# Patient Record
Sex: Male | Born: 1965 | Race: White | Hispanic: No | Marital: Married | Smoking: Never smoker
Health system: Southern US, Community
[De-identification: ages and names within clinical notes are randomized; demographics above are authoritative.]

## PROBLEM LIST (undated history)

## (undated) DIAGNOSIS — F329 Major depressive disorder, single episode, unspecified: Secondary | ICD-10-CM

## (undated) DIAGNOSIS — E785 Hyperlipidemia, unspecified: Secondary | ICD-10-CM

## (undated) DIAGNOSIS — F32A Depression, unspecified: Secondary | ICD-10-CM

## (undated) HISTORY — PX: TONSILLECTOMY: SUR1361

---

## 2016-09-05 DIAGNOSIS — E782 Mixed hyperlipidemia: Secondary | ICD-10-CM | POA: Insufficient documentation

## 2016-09-05 DIAGNOSIS — M7702 Medial epicondylitis, left elbow: Secondary | ICD-10-CM | POA: Insufficient documentation

## 2016-12-24 ENCOUNTER — Emergency Department (HOSPITAL_BASED_OUTPATIENT_CLINIC_OR_DEPARTMENT_OTHER)
Admission: EM | Admit: 2016-12-24 | Discharge: 2016-12-24 | Disposition: A | Discharge status: Home / Self Care | Payer: Worker's Compensation | Attending: Emergency Medicine | Admitting: Emergency Medicine

## 2016-12-24 ENCOUNTER — Emergency Department (HOSPITAL_BASED_OUTPATIENT_CLINIC_OR_DEPARTMENT_OTHER): Payer: Worker's Compensation

## 2016-12-24 DIAGNOSIS — Y990 Civilian activity done for income or pay: Secondary | ICD-10-CM | POA: Insufficient documentation

## 2016-12-24 DIAGNOSIS — Y939 Activity, unspecified: Secondary | ICD-10-CM | POA: Insufficient documentation

## 2016-12-24 DIAGNOSIS — Y929 Unspecified place or not applicable: Secondary | ICD-10-CM | POA: Insufficient documentation

## 2016-12-24 DIAGNOSIS — S93602A Unspecified sprain of left foot, initial encounter: Secondary | ICD-10-CM

## 2016-12-24 DIAGNOSIS — Z79899 Other long term (current) drug therapy: Secondary | ICD-10-CM | POA: Diagnosis not present

## 2016-12-24 DIAGNOSIS — S93401A Sprain of unspecified ligament of right ankle, initial encounter: Secondary | ICD-10-CM | POA: Insufficient documentation

## 2016-12-24 DIAGNOSIS — S99911A Unspecified injury of right ankle, initial encounter: Secondary | ICD-10-CM | POA: Diagnosis present

## 2016-12-24 DIAGNOSIS — W1839XA Other fall on same level, initial encounter: Secondary | ICD-10-CM | POA: Insufficient documentation

## 2016-12-24 NOTE — Discharge Instructions (Signed)
Wear cam walker as applied.  Ice for 20 minutes every 2 hours while awake for the next 2 days.  Rest.  Follow-up with Dr. Magnus IvanBlackman if your symptoms are not improving in the next 3-4 days. His contact information has been provided in this discharge summary for you to call and make these arrangements.

## 2016-12-24 NOTE — ED Provider Notes (Signed)
[facility] Provider Note   CSN: [id number] Arrival date & time: 12/24/16  1610  By signing my name below, I, Linna Darner, attest that this documentation has been prepared under the direction and in the presence of physician practitioner, Geoffery Lyons, MD. Electronically Signed: Linna Darner, Scribe. 12/24/2016. 4:24 PM.  History   Chief Complaint Chief Complaint  Patient presents with  . Ankle Pain    The history is provided by the patient. No language interpreter was used.     HPI Comments: Patrick Lindsey is a 51 y.o. male who presents to the Emergency Department complaining of sudden onset, constant, severe, lateral right ankle pain beginning earlier today. 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 He states he cannot bear weight on his right foot or ambulate secondary to his ankle pain. He endorses pain exacerbation with any degree of applied pressure to his lateral right ankle. No medications tried PTA. Pt denies joint swelling, numbness/tingling, or any other associated symptoms.  No past medical history on file.  There are no active problems to display for this patient.   No past surgical history on file.     Home Medications    Prior to Admission medications   Medication Sig Start Date End Date Taking? Authorizing Provider  buPROPion (WELLBUTRIN) 75 MG tablet Take 75 mg by mouth 2 (two) times daily.   Yes Historical Provider, MD  DULoxetine (CYMBALTA) 60 MG capsule Take 60 mg by mouth daily.   Yes Historical Provider, MD  loratadine (CLARITIN) 10 MG tablet Take 10 mg by mouth daily.   Yes Historical Provider, MD  simvastatin (ZOCOR) 40 MG tablet Take 40 mg by mouth daily.   Yes Historical Provider, MD    Family History No family history on file.  Social History Social History  Substance Use Topics    . Smoking status: Not on file  . Smokeless tobacco: Not on file  . Alcohol use Not on file     Allergies   Patient has no allergy information on record.   Review of Systems Review of Systems  Musculoskeletal: Positive for arthralgias and gait problem. Negative for joint swelling.  Neurological: Negative for numbness.  All other systems reviewed and are negative.  Physical Exam Updated Vital Signs BP (!) 158/108 (BP Location: Left Arm)   Pulse 88   Temp 98.4 F (36.9 C) (Oral)   Resp 20   Ht 5\' 11"  (1.803 m)   Wt 234 lb (106.1 kg)   SpO2 100%   BMI 32.64 kg/m   Physical Exam  Constitutional: He is oriented to person, place, and time. He appears well-developed and well-nourished.  HENT:  Head: Normocephalic.  Eyes: EOM are normal.  Neck: Normal range of motion.  Pulmonary/Chest: Effort normal.  Abdominal: He exhibits no distension.  Musculoskeletal: Normal range of motion. He exhibits tenderness.  The right foot and ankle appear grossly normal. There is exquisite tenderness over the posterior calcaneus. There is no tenderness over the achilles. Thompson test is negative.  Neurological: He is alert and oriented to person, place, and time.  Psychiatric: He has a normal mood and affect.  Nursing note and vitals reviewed.  ED Treatments / Results  Labs (all labs ordered are listed, but only abnormal results are displayed) Labs Reviewed - No data to display  EKG  EKG  Interpretation None       Radiology No results found.  Procedures Procedures (including critical care time)  DIAGNOSTIC STUDIES: Oxygen Saturation is 100% on RA, normal by my interpretation.    COORDINATION OF CARE: 4:28 PM Discussed treatment plan with pt at bedside and pt agreed to plan.  Medications Ordered in ED Medications - No data to display   Initial Impression / Assessment and Plan / ED Course  I have reviewed the triage vital signs and the nursing notes.  Pertinent labs &  imaging results that were available during my care of the patient were reviewed by me and considered in my medical decision making (see chart for details).  X-rays are negative for fracture. There is the finding of an os perineum adjacent to the cuboid bone. He is not tender in this area and I am certain does not represent a fracture. His tenderness is over the posterior aspect of the calcaneus near the insertion of the Achilles tendon. There is no evidence for tendon rupture as Thompson's test is negative. It is possible that he may have a partial tear. For this reason he will be placed in a Cam Walker and will follow-up with orthopedics if not improving in the next several days.  Final Clinical Impressions(s) / ED Diagnoses   Final diagnoses:  None    New Prescriptions New Prescriptions   No medications on file   I personally performed the services described in this documentation, which was scribed in my presence. The recorded information has been reviewed and is accurate.       Geoffery Lyonsouglas Barbra Miner, MD 12/24/16 [phone]

## 2016-12-24 NOTE — ED Triage Notes (Signed)
Pt was stepping backwards off of a pallet when pallet slat broke, causing pt to fall backwards and left ankle was trapped between slats and twisted. Pt also reports some left knee pain from fall also.

## 2017-01-04 ENCOUNTER — Other Ambulatory Visit (INDEPENDENT_AMBULATORY_CARE_PROVIDER_SITE_OTHER): Payer: Self-pay

## 2017-01-04 ENCOUNTER — Ambulatory Visit (INDEPENDENT_AMBULATORY_CARE_PROVIDER_SITE_OTHER): Payer: Worker's Compensation | Admitting: Physician Assistant

## 2017-01-04 DIAGNOSIS — F419 Anxiety disorder, unspecified: Secondary | ICD-10-CM | POA: Insufficient documentation

## 2017-01-04 DIAGNOSIS — F329 Major depressive disorder, single episode, unspecified: Secondary | ICD-10-CM | POA: Insufficient documentation

## 2017-01-04 DIAGNOSIS — S99911D Unspecified injury of right ankle, subsequent encounter: Secondary | ICD-10-CM

## 2017-01-04 DIAGNOSIS — M7661 Achilles tendinitis, right leg: Secondary | ICD-10-CM

## 2017-01-04 DIAGNOSIS — F32A Depression, unspecified: Secondary | ICD-10-CM | POA: Insufficient documentation

## 2017-01-04 MED ORDER — DICLOFENAC SODIUM 1 % TD GEL: 4.0000 g | Freq: Four times a day (QID) | TRANSDERMAL | Status: AC

## 2017-01-04 NOTE — Progress Notes (Signed)
Office Visit Note   Patient: Patrick Lindsey           Date of Birth: 1966/02/19           MRN: [id number] Visit Date: 01/04/2017              Requested by: Dan Maker, MD [address] [place], [place] [address] PCP: Dan Maker, MD   Assessment & Plan: Visit Diagnoses:  1. Achilles tendinitis, right leg     Plan: Keep him out of work for the next 2 weeks to reevaluate in 2 weeks. He is to stay in the Cam Walker boot when up ambulating place and 9/16 inch heel lift in his Cam Conservation officer, nature. We'll send a physical therapy for modalities range of motion of the ankle particularly to the insertion of the Achilles and gastroc soleus stretching. Converged ice and 1520 minutes 2-3 times daily to the Achilles insertion. Also have him apply Voltaren gel 4 g 4 times daily to the right heel. Discussed shoe wear with the patient.  Follow-Up Instructions: Return in about 2 weeks (around 01/18/2017).   Orders:  No orders of the defined types were placed in this encounter.  Meds ordered this encounter  Medications  . diclofenac sodium (VOLTAREN) 1 % transdermal gel 4 g      Procedures: No procedures performed   Clinical Data: No additional findings.   Subjective: Right ankle pain  HPI Patrick Lindsey is a 51 year old male who were seen for the first time for an injury which occurred at work on 12/22/2016. He reports that he was standing on a pallet is some materials off of the patella with SLAP broke he fell through the width hyper flexing his ankle. He went to the ER where radiographs were obtained of his right foot and ankle. These were negative for acute fracture or bony abnormalities. I personally reviewed these. He was placed in the Cam Walker boot due to the tenderness at the Achilles insertion. Review of Systems No chest pain shortness breath fevers chills nausea or vomiting  Objective: Vital Signs: There were no vitals taken for this visit.  Physical  Exam  Constitutional: He is oriented to person, place, and time. He appears well-developed and well-nourished. No distress.  Cardiovascular: Intact distal pulses.   Pulmonary/Chest: Effort normal.  Neurological: He is alert and oriented to person, place, and time.  Psychiatric: He has a normal mood and affect.    Ortho Exam Right ankle he has good dorsiflexion plantar flexion without pain forced dorsiflexion of the ankle causes pain knee at the Achilles insertion. Negative Thompson's test on the right. Calves are supple nontender bilaterally. 5 out of 5 strengths inversion eversion against resistance bilateral feet. No tenderness over the posterior tibial tendons bilaterally. He has minimal tenderness over the right  peroneal tendons. No ecchymosis edema appreciated either foot. No tenderness over the anterior aspect of both ankles or the sinus tarsi bilaterally He has evidence pes tinea of bilateral feet. Specialty Comments:  No specialty comments available.  Imaging: No results found.   PMFS History: Patient Active Problem List   Diagnosis Date Noted  . Anxiety 01/04/2017  . Depression 01/04/2017  . 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 Mixed hyperlipidemia 09/05/2016   No past medical history on file.  No family history on file.  No past surgical history on file. Social History   Occupational History  . Not on file.   Social History Main Topics  .  Smoking status: Not on file  . Smokeless tobacco: Not on file  . Alcohol use Not on file  . Drug use: Unknown  . Sexual activity: Not on file

## 2017-01-04 NOTE — Progress Notes (Signed)
Is this WC? He was checked in under [facility]

## 2017-01-10 ENCOUNTER — Telehealth (INDEPENDENT_AMBULATORY_CARE_PROVIDER_SITE_OTHER): Payer: Self-pay

## 2017-01-10 NOTE — Telephone Encounter (Signed)
Received vm requesting the last office and work note and PT order for this work comp patient. Faxed to [phone].

## 2017-01-12 ENCOUNTER — Telehealth (INDEPENDENT_AMBULATORY_CARE_PROVIDER_SITE_OTHER): Payer: Self-pay

## 2017-01-12 NOTE — Telephone Encounter (Signed)
Received vm from case mgr just wanting to let us know she was now on the file and PT has been approved and sent for scheduling.

## 2017-01-13 ENCOUNTER — Encounter: Payer: Self-pay | Admitting: Rehabilitation

## 2017-01-13 ENCOUNTER — Ambulatory Visit: Payer: BLUE CROSS/BLUE SHIELD | Attending: Physician Assistant | Admitting: Rehabilitation

## 2017-01-13 DIAGNOSIS — M25571 Pain in right ankle and joints of right foot: Secondary | ICD-10-CM

## 2017-01-13 DIAGNOSIS — M25671 Stiffness of right ankle, not elsewhere classified: Secondary | ICD-10-CM

## 2017-01-13 NOTE — Therapy (Signed)
[facility] [address] [place], [place], [address] Phone: [phone]   Fax:  [phone]  Physical Therapy Evaluation  Patient Details  Name: Patrick Lindsey MRN: [id number] Date of Birth: 03-27-1966 Referring Provider: Chestine Spore  Encounter Date: 01/13/2017      PT End of Session - 01/13/17 1158    Visit Number 1   Date for PT Re-Evaluation 02/24/17   PT Start Time 0930   PT Stop Time 1015   PT Time Calculation (min) 45 min   Activity Tolerance Patient tolerated treatment well      History reviewed. No pertinent past medical history.  History reviewed. No pertinent surgical history.  There were no vitals filed for this visit.       Subjective Assessment - 01/13/17 0930    Subjective Pt presents with R ankle pain after standing on a wooden pallet that broke at work on 12/22/16.  Went to ER 3/24 xrays normal given a diagnosis of a sprain and given a walking boot.  Went to [employer] and given a new diagnosis of achilles tear.  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 Also has a lift in the boot.      Limitations Standing;Walking   Diagnostic tests xrays normal   Patient Stated Goals back to work, back to normal   Currently in Pain? Yes   Pain Score 4    Pain Location Ankle   Pain Orientation Right   Pain Descriptors / Indicators Aching   Pain Type Acute pain   Pain Onset 1 to 4 weeks ago   Pain Frequency Constant   Aggravating Factors  walking and standing up to 7/10   Pain Relieving Factors rest, ice            [facility] PT Assessment - 01/13/17 0001      Assessment   Medical Diagnosis R achilles insertion tear/ankle sprain   Referring Provider Clark   Onset Date/Surgical Date 12/22/16   Next MD Visit 01/18/17   Prior Therapy no     Precautions   Precaution Comments to be in CAM boot with lift when not at home     Restrictions   Weight Bearing Restrictions No     Balance Screen   Has the patient fallen in the past 6 months No     Prior Function   Vocation Full time employment   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      ROM / Strength   AROM / PROM / Strength AROM;PROM;Strength     AROM   Overall AROM Comments in seated   AROM Assessment Site Ankle   Right/Left Ankle Right   Right Ankle Dorsiflexion 15   Right Ankle Plantar Flexion 40   Right Ankle Inversion 35   Right Ankle Eversion 10     PROM   PROM Assessment Site Ankle   Right/Left Ankle Right   Right Ankle Dorsiflexion 14   Right Ankle Plantar Flexion 40   Right Ankle Inversion 35   Right Ankle Eversion 10     Strength   Strength Assessment Site Ankle   Right/Left Ankle Right   Right Ankle Dorsiflexion 4+/5   Right Ankle Plantar Flexion 4+/5   Right Ankle Inversion 4/5   Right Ankle Eversion 4/5     Palpation   Palpation comment 3  ttp R bony abnormality at achilles insertion     Ambulation/Gait   Gait Comments ambulating toe out, decreased DF and knee flexion                   [facility] Adult PT Treatment/Exercise - 01/13/17 0001      Modalities   Modalities Ultrasound     Ultrasound   Ultrasound Location R achilles   Ultrasound Parameters 50%, 1 MHz, 1.3wcm2   Ultrasound Goals Pain                PT Education - 01/13/17 1157    Education provided Yes   Person(s) Educated Patient   Methods Explanation;Demonstration;Handout;Tactile cues;Verbal cues   Comprehension Verbalized understanding;Returned demonstration             PT Long Term Goals - 01/13/17 1202      PT LONG TERM GOAL #1   Title pt will return to work without limitations   Time 6   Period Weeks   Status New     PT LONG TERM GOAL #2   Title Pt will return to ambulating without boot without deviations   Time 6   Period Weeks   Status New     PT LONG TERM GOAL #3   Title Pt  will demonstrate SL stance equal to the well leg   Time 6   Period Weeks   Status New               Plan - 01/13/17 1158    Clinical Impression Statement Pt presents with R ankle pain and stiffness after a mild achilles tear when his foot fell through a wood slat at work.  Pt does have a new bony abnormality at the achilles insertion that is very tender to the touch.  ROM not significantly limited but mildly painful, strength also limited by pain but not very weak on testing.  Gait abnormalities present as well as activity and work intolerance.     Rehab Potential Excellent   PT Frequency 2x / week   PT Duration 6 weeks   PT Treatment/Interventions Iontophoresis /ml Dexamethasone;Cryotherapy;Neurosurgeon;Therapeutic activities;Therapeutic exercise;Balance training;Neuromuscular re-education   PT Next Visit Plan R ankle ROM, gait, gentle strength, modalities as needed   Consulted and Agree with Plan of Care Patient      Patient will benefit from skilled therapeutic intervention in order to improve the following deficits and impairments:  Abnormal gait, Decreased activity tolerance, Decreased range of motion, Decreased strength, Pain  Visit Diagnosis: Pain in right ankle and joints of right foot - Plan: PT plan of care cert/re-cert  Stiffness of right ankle, not elsewhere classified - Plan: PT plan of care cert/re-cert     Problem List Patient Active Problem List   Diagnosis Date Noted  . Anxiety 01/04/2017  . Depression 01/04/2017  . 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 Mixed hyperlipidemia 09/05/2016    Idamae Lusher, DPT, CMP 01/13/2017, 12:06 PM  [facility] [address] [place], [place], [address] Phone: [phone]   Fax:  [phone]  Name: Patrick Lindsey MRN: [id number] Date of Birth: October 25, 1965

## 2017-01-13 NOTE — Patient Instructions (Signed)
Ankle alphabet, seated calf stretch with towel, seated heel and toe raise

## 2017-01-16 ENCOUNTER — Telehealth (INDEPENDENT_AMBULATORY_CARE_PROVIDER_SITE_OTHER): Payer: Self-pay

## 2017-01-16 ENCOUNTER — Telehealth: Payer: Self-pay

## 2017-01-16 NOTE — Telephone Encounter (Signed)
Received a call from [place] from ? ( could not understand the name of the company but said they were in the process of getting pt scheduled for PT) who needed PT rx sent to them. Faxed to [phone]

## 2017-01-18 ENCOUNTER — Encounter (INDEPENDENT_AMBULATORY_CARE_PROVIDER_SITE_OTHER): Payer: Self-pay | Admitting: Physician Assistant

## 2017-01-18 ENCOUNTER — Ambulatory Visit (INDEPENDENT_AMBULATORY_CARE_PROVIDER_SITE_OTHER): Payer: Worker's Compensation | Admitting: Physician Assistant

## 2017-01-18 DIAGNOSIS — M7661 Achilles tendinitis, right leg: Secondary | ICD-10-CM

## 2017-01-18 NOTE — Progress Notes (Signed)
   Office Visit Note   Patient: Patrick Lindsey           Date of Birth: 08/08/1966           MRN: [id number] Visit Date: 01/18/2017              Requested by: Patrick Maker, MD [address] [place], [place] [address] PCP: Patrick Maker, MD   Assessment & Plan: Visit Diagnoses:  1. Achilles tendinitis, right leg     Plan: A lift in her shoes half inch. Discontinue Cam Conservation officer, nature. Continue Voltaren gel to the Achilles insertion up to 4 times daily. Continue stretching. Keep him out of work for the next 2 weeks and then have him follow up in 2 weeks to reevaluate his work status.  Follow-Up Instructions: Return in about 2 weeks (around 02/01/2017) for Dr. Magnus Lindsey.   Orders:  No orders of the defined types were placed in this encounter.  No orders of the defined types were placed in this encounter.     Procedures: No procedures performed   Clinical Data: No additional findings.   Subjective: Chief Complaint  Patient presents with  . Right Leg - Pain, Follow-up    HPI [name] states that overall he is doing better. Does have some stiffness first thing in the morning and aching in the evenings. His pain is 5 out of 10 at worst and 1-2 out of 10 most of the time. He states he needs to be 100% to go back to work working 11-14 hour days. Review of Systems   Objective: Vital Signs: There were no vitals taken for this visit.  Physical Exam  Ortho Exam Right Achilles intact. Gastroc tight. Has tenderness over the Achilles insertion. No erythema or abnormal warmth over the Achilles insertion. Right calf supple nontender Specialty Comments:  No specialty comments available.  Imaging: No results found.   PMFS History: Patient Active Problem List   Diagnosis Date Noted  . Anxiety 01/04/2017  . Depression 01/04/2017  . 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 Mixed hyperlipidemia 09/05/2016   No past medical history on file.  No  family history on file.  No past surgical history on file. Social History   Occupational History  . Not on file.   Social History Main Topics  . Smoking status: Never Smoker  . Smokeless tobacco: Never Used  . Alcohol use Not on file  . Drug use: Unknown  . Sexual activity: Not on file

## 2017-01-19 ENCOUNTER — Telehealth (INDEPENDENT_AMBULATORY_CARE_PROVIDER_SITE_OTHER): Payer: Self-pay | Admitting: Orthopaedic Surgery

## 2017-01-19 NOTE — Telephone Encounter (Signed)
Faxed to provided number  

## 2017-01-19 NOTE — Telephone Encounter (Signed)
Pt wc adjuster from [employer] requesting pt visit not faxed to [phone] attn: Severiano Gilbert

## 2017-01-20 ENCOUNTER — Encounter: Payer: Self-pay | Admitting: Physical Therapy

## 2017-01-24 ENCOUNTER — Encounter: Payer: Self-pay | Admitting: Physical Therapy

## 2017-01-26 ENCOUNTER — Encounter: Payer: Self-pay | Admitting: Physical Therapy

## 2017-01-31 ENCOUNTER — Encounter (INDEPENDENT_AMBULATORY_CARE_PROVIDER_SITE_OTHER): Payer: Self-pay | Admitting: Orthopaedic Surgery

## 2017-01-31 ENCOUNTER — Ambulatory Visit (INDEPENDENT_AMBULATORY_CARE_PROVIDER_SITE_OTHER): Payer: Worker's Compensation | Admitting: Orthopaedic Surgery

## 2017-01-31 DIAGNOSIS — M7661 Achilles tendinitis, right leg: Secondary | ICD-10-CM | POA: Diagnosis not present

## 2017-01-31 DIAGNOSIS — M7662 Achilles tendinitis, left leg: Secondary | ICD-10-CM | POA: Insufficient documentation

## 2017-01-31 NOTE — Progress Notes (Signed)
The patient is continuing to follow-up for severe Achilles tendinitis of his right ankle. It is now improving some however his left ankle is now quite swollen and painful. This is limiting his physical therapy as well. He's been ibuprofen and Voltaren gel. He's been trying ice as a therapeutic stretch band. He is allergic to prednisone. It causes stomach to be upset.  On examination both Achilles are intact. There is a negative Thompson test bilaterally. The swelling is gone completely down the right side. He has extensive swelling on the left side in terms of posterior ankle. He's very tender over the left Achilles tendon. He denies any history of inflammatory process such as gout.  At this point I will not have to keep him out of work another 2 weeks. We'll have him wear his Cam Walker boot now on the left side. He'll continue the stretching exercises and anti-inflammatories. We'll reevaluate him in 2 weeks.

## 2017-02-01 ENCOUNTER — Telehealth (INDEPENDENT_AMBULATORY_CARE_PROVIDER_SITE_OTHER): Payer: Self-pay

## 2017-02-01 NOTE — Telephone Encounter (Signed)
Faxed the 01/31/17 office and work note to adj per her request

## 2017-02-15 ENCOUNTER — Ambulatory Visit (INDEPENDENT_AMBULATORY_CARE_PROVIDER_SITE_OTHER): Payer: Worker's Compensation | Admitting: Physician Assistant

## 2017-02-15 ENCOUNTER — Encounter (INDEPENDENT_AMBULATORY_CARE_PROVIDER_SITE_OTHER): Payer: Self-pay | Admitting: Physician Assistant

## 2017-02-15 DIAGNOSIS — M7661 Achilles tendinitis, right leg: Secondary | ICD-10-CM | POA: Diagnosis not present

## 2017-02-15 DIAGNOSIS — M7662 Achilles tendinitis, left leg: Secondary | ICD-10-CM | POA: Diagnosis not present

## 2017-02-15 NOTE — Progress Notes (Signed)
Mr. Patrick Lindsey returns today for follow-up of his bilateral insertional Achilles tendinitis. He states that physical therapy's help a lot. He is no longer wearing the Cam Walker boot. Still having pain with walking just limited due to the pain in both heels. Has soreness and burning at the Achilles insertion both heels with driving. He is using Voltaren gel once per day. Also using ice. He is been good about shoewear.  Physical exam: Bilateral feet dorsal pedal pulses are intact. He has tenderness over the insertion of the Achilles bilaterally. Or so over the lateral aspect the insertion of both Achilles. Bilateral Achilles are intact. There is no rashes skin lesions ulcerations erythema of either foot. Remainder of bilateral feet are nontender. Gastroc testing reveals that his gastrocs are less tight than they have been on previous exam.  Plan this point time he'll remain out of work as advised using Voltaren gel up to 4 times a day. Continue physical therapy for gastrocsoleus stretching and modalities. See him back in 2 weeks and evaluate his work status at that point in time.

## 2017-02-16 ENCOUNTER — Telehealth (INDEPENDENT_AMBULATORY_CARE_PROVIDER_SITE_OTHER): Payer: Self-pay

## 2017-02-16 NOTE — Telephone Encounter (Signed)
Faxed 02/15/17 office note and work note to [employer] per their request

## 2017-03-01 ENCOUNTER — Ambulatory Visit (INDEPENDENT_AMBULATORY_CARE_PROVIDER_SITE_OTHER): Payer: Worker's Compensation | Admitting: Orthopaedic Surgery

## 2017-03-01 ENCOUNTER — Encounter (INDEPENDENT_AMBULATORY_CARE_PROVIDER_SITE_OTHER): Payer: Self-pay

## 2017-03-01 DIAGNOSIS — M7662 Achilles tendinitis, left leg: Secondary | ICD-10-CM | POA: Diagnosis not present

## 2017-03-01 DIAGNOSIS — M7661 Achilles tendinitis, right leg: Secondary | ICD-10-CM

## 2017-03-01 NOTE — Progress Notes (Signed)
The patient is following up from a work-related injury to his right ankle. He sustained significant strain to the Achilles tendon which led to an Achilles tendinitis without a rupture. Having him walk in the cam walking boot for a period time did cause him to be off balance and this is created some right knee pain as well as left ankle tendinitis. He's been to physical therapy as well as anti-inflammatories, ice, rest, and activity modification. He's been out of therapy now for 2 weeks. 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  On examination leg him supine on the table his Patrick Lindsey test is negative. There is some slight pain near the Achilles insertion on the right side but is minimal. He's had good range of motion of his ankle and is neurovascularly intact.  I like him to heal this completely. With that being said I will keep him out of work for 4 weeks with a goal of her full return to full work duties and activities but this is completely healed 4 weeks from now.

## 2017-03-03 ENCOUNTER — Telehealth (INDEPENDENT_AMBULATORY_CARE_PROVIDER_SITE_OTHER): Payer: Self-pay

## 2017-03-03 NOTE — Telephone Encounter (Signed)
Received vm from CSR at [employer] requesting the 03/01/17 office and work note. Faxed to [phone].

## 2017-03-30 ENCOUNTER — Ambulatory Visit (INDEPENDENT_AMBULATORY_CARE_PROVIDER_SITE_OTHER): Payer: Worker's Compensation | Admitting: Orthopaedic Surgery

## 2017-03-30 DIAGNOSIS — M7661 Achilles tendinitis, right leg: Secondary | ICD-10-CM

## 2017-03-30 NOTE — Progress Notes (Signed)
The patient is following up for severe Achilles tendinitis of the right ankle that occurred from a work-related accident. He is gotten better significantly over the last month. He changed jobs as well. He works on stretching daily and has minimal discomfort at this point.  On examination his Achilles is intact. His Thompson test is negative on the right ankle. He is neurovascular intact. His pain is minimal.  At this point he is released to full activities and will follow up as needed. There is no permanent partial disability as a relates to this injury and tendinitis. He should hopefully resolve this issue completely with time. All questions were encouraged and answered.

## 2017-03-31 ENCOUNTER — Telehealth (INDEPENDENT_AMBULATORY_CARE_PROVIDER_SITE_OTHER): Payer: Self-pay

## 2017-03-31 NOTE — Telephone Encounter (Signed)
Faxed 03/30/17 office note to [employer]

## 2017-04-14 ENCOUNTER — Emergency Department (HOSPITAL_COMMUNITY)
Admission: EM | Admit: 2017-04-14 | Discharge: 2017-04-14 | Disposition: A | Discharge status: Home / Self Care | Payer: PRIVATE HEALTH INSURANCE | Attending: Emergency Medicine | Admitting: Emergency Medicine

## 2017-04-14 DIAGNOSIS — M2550 Pain in unspecified joint: Secondary | ICD-10-CM | POA: Diagnosis not present

## 2017-04-14 DIAGNOSIS — Z79899 Other long term (current) drug therapy: Secondary | ICD-10-CM | POA: Insufficient documentation

## 2017-04-14 DIAGNOSIS — R531 Weakness: Secondary | ICD-10-CM | POA: Diagnosis present

## 2017-04-14 LAB — HEPATIC FUNCTION PANEL
ALT: 28 U/L (ref 17–63)
AST: 23 U/L (ref 15–41)
Albumin: 4.5 g/dL (ref 3.5–5.0)
Alkaline Phosphatase: 71 U/L (ref 38–126)
BILIRUBIN INDIRECT: 0.9 mg/dL (ref 0.3–0.9)
Bilirubin, Direct: 0.2 mg/dL (ref 0.1–0.5)
TOTAL PROTEIN: 7.1 g/dL (ref 6.5–8.1)
Total Bilirubin: 1.1 mg/dL (ref 0.3–1.2)

## 2017-04-14 LAB — BASIC METABOLIC PANEL
ANION GAP: 11 (ref 5–15)
BUN: 19 mg/dL (ref 6–20)
CALCIUM: 9.4 mg/dL (ref 8.9–10.3)
CO2: 21 mmol/L — AB (ref 22–32)
Chloride: 111 mmol/L (ref 101–111)
Creatinine, Ser: 1.5 mg/dL — ABNORMAL HIGH (ref 0.61–1.24)
GFR calc non Af Amer: 51 mL/min — ABNORMAL LOW (ref >60)
GFR, EST AFRICAN AMERICAN: 60 mL/min — AB (ref >60)
Glucose, Bld: 125 mg/dL — ABNORMAL HIGH (ref 65–99)
Potassium: 3.8 mmol/L (ref 3.5–5.1)
Sodium: 143 mmol/L (ref 135–145)

## 2017-04-14 LAB — CBC
HCT: 45.4 % (ref 39.0–52.0)
HEMOGLOBIN: 16 g/dL (ref 13.0–17.0)
MCH: 28.5 pg (ref 26.0–34.0)
MCHC: 35.2 g/dL (ref 30.0–36.0)
MCV: 80.8 fL (ref 78.0–100.0)
Platelets: 255 10*3/uL (ref 150–400)
RBC: 5.62 MIL/uL (ref 4.22–5.81)
RDW: 12.8 % (ref 11.5–15.5)
WBC: 8.9 10*3/uL (ref 4.0–10.5)

## 2017-04-14 LAB — I-STAT CG4 LACTIC ACID, ED: LACTIC ACID, VENOUS: 1.2 mmol/L (ref 0.5–1.9)

## 2017-04-14 LAB — I-STAT TROPONIN, ED: TROPONIN I, POC: 0 ng/mL (ref 0.00–0.08)

## 2017-04-14 LAB — SEDIMENTATION RATE: SED RATE: 4 mm/h (ref 0–16)

## 2017-04-14 MED ORDER — SODIUM CHLORIDE 0.9 % IV SOLN
INTRAVENOUS | Status: DC
  Administered 2017-04-14: 19:00:00 via INTRAVENOUS

## 2017-04-14 MED ORDER — OXYCODONE-ACETAMINOPHEN 5-325 MG PO TABS
1.0000 | ORAL_TABLET | Freq: Once | ORAL | Status: AC
  Administered 2017-04-14: 1 via ORAL
  Filled 2017-04-14: qty 1

## 2017-04-14 MED ORDER — PREDNISONE 10 MG (21) PO TBPK: ORAL_TABLET | Freq: Every day | ORAL | 0 refills | Status: DC

## 2017-04-14 MED ORDER — SODIUM CHLORIDE 0.9 % IV BOLUS (SEPSIS)
1000.0000 mL | Freq: Once | INTRAVENOUS | Status: AC
  Administered 2017-04-14: 1000 mL via INTRAVENOUS

## 2017-04-14 MED ORDER — METHYLPREDNISOLONE SODIUM SUCC 125 MG IJ SOLR
125.0000 mg | Freq: Once | INTRAMUSCULAR | Status: AC
  Administered 2017-04-14: 125 mg via INTRAVENOUS
  Filled 2017-04-14: qty 2

## 2017-04-14 MED ORDER — HYDROCODONE-ACETAMINOPHEN 5-325 MG PO TABS: 2.0000 | ORAL_TABLET | ORAL | 0 refills | Status: DC | PRN

## 2017-04-14 NOTE — ED Provider Notes (Signed)
[facility]-EMERGENCY DEPT Provider Note   CSN: [id number] Arrival date & time: 04/14/17  1555     History   Chief Complaint Chief Complaint  Patient presents with  . Weakness    HPI Ulysees BarnsWilliam Letson is a 51 y.o. male.  51 year old male presents with acute onset of pain in his bilateral knees as well as elbows and some difficulty turning his head. Symptoms started 2 days ago but 3 days prior to that he had experienced subjective clamminess with a temperature of 99. He denies any rashes or recent travel history. Denies any insect exposures. No prior history of inflammatory joint disease. No trauma. Pain is worse with standing and better with rest. Denies any pain to his hip joints or wrists or feet. Did take 1 dose of Motrin and is unsure if that helped. No edema to his lower extremities.      No past medical history on file.  Patient Active Problem List   Diagnosis Date Noted  . Achilles tendinitis, right leg 01/31/2017  . Achilles tendinitis, left leg 01/31/2017  . Anxiety 01/04/2017  . Depression 01/04/2017  . 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 Mixed hyperlipidemia 09/05/2016    No past surgical history on file.     Home Medications    Prior to Admission medications   Medication Sig Start Date End Date Taking? Authorizing Provider  buPROPion (WELLBUTRIN) 75 MG tablet Take 75 mg by mouth 2 (two) times daily.    [provider]  DULoxetine (CYMBALTA) 60 MG capsule Take 60 mg by mouth daily.    [provider]  loratadine (CLARITIN) 10 MG tablet Take 10 mg by mouth daily.    [provider]  simvastatin (ZOCOR) 40 MG tablet Take 40 mg by mouth daily.    [provider]    Family History No family history on file.  Social History Social History  Substance Use Topics  . Smoking status: Never Smoker  . Smokeless tobacco: Never Used  . Alcohol use Not on file     Allergies   Patient has no known  allergies.   Review of Systems Review of Systems  All other systems reviewed and are negative.    Physical Exam Updated Vital Signs BP (!) 152/97 (BP Location: Right Arm)   Pulse 87   Temp 97.9 F (36.6 C) (Oral)   Resp 16   SpO2 96%   Physical Exam  Constitutional: He is oriented to person, place, and time. He appears well-developed and well-nourished.  Non-toxic appearance. No distress.  HENT:  Head: Normocephalic and atraumatic.  Eyes: Pupils are equal, round, and reactive to light. Conjunctivae, EOM and lids are normal.  Neck: Normal range of motion. Neck supple. No tracheal deviation present. No thyroid mass present.  Cardiovascular: Normal rate, regular rhythm and normal heart sounds.  Exam reveals no gallop.   No murmur heard. Pulmonary/Chest: Effort normal and breath sounds normal. No stridor. No respiratory distress. He has no decreased breath sounds. He has no wheezes. He has no rhonchi. He has no rales.  Abdominal: Soft. Normal appearance and bowel sounds are normal. He exhibits no distension. There is no tenderness. There is no rebound and no CVA tenderness.  Musculoskeletal: Normal range of motion. He exhibits no edema or tenderness.  Full range of motion at patient's knees and elbows. No warmness to any of his joints. No gross effusions noted. Nontender along his cervical spine. No rashes noted.  Neurological: He is alert and oriented to  person, place, and time. He has normal strength. No cranial nerve deficit or sensory deficit. GCS eye subscore is 4. GCS verbal subscore is 5. GCS motor subscore is 6.  Skin: Skin is warm and dry. No abrasion and no rash noted.  Psychiatric: He has a normal mood and affect. His speech is normal and behavior is normal.  Nursing note and vitals reviewed.    ED Treatments / Results  Labs (all labs ordered are listed, but only abnormal results are displayed) Labs Reviewed  CULTURE, BLOOD (ROUTINE X 2)  CULTURE, BLOOD (ROUTINE X 2)   CBC  BASIC METABOLIC PANEL  SEDIMENTATION RATE  HEPATIC FUNCTION PANEL  I-STAT TROPOININ, ED  I-STAT CG4 LACTIC ACID, ED    EKG  EKG Interpretation  Date/Time:  Friday April 14 2017 16:58:27 EDT Ventricular Rate:  82 PR Interval:    QRS Duration: 100 QT Interval:  386 QTC Calculation: 451 R Axis:   42 Text Interpretation:  Sinus rhythm Baseline wander in lead(s) II III aVF Confirmed by Lorre Nick ([id number]) on 04/14/2017 5:15:23 PM       Radiology No results found.  Procedures Procedures (including critical care time)  Medications Ordered in ED Medications  sodium chloride 0.9 % bolus 1,000 mL (not administered)  0.9 %  sodium chloride infusion (not administered)     Initial Impression / Assessment and Plan / ED Course  I have reviewed the triage vital signs and the nursing notes.  Pertinent labs & imaging results that were available during my care of the patient were reviewed by me and considered in my medical decision making (see chart for details).     Patient's evaluation here is reassuring. He has no signs of leukocytosis. Does have an elevated creatinine and he is unsure of his baseline. Sedimentation rate negative. Lactate normal. Patient given IV dose of steroids here and was instructed to avoid all NSAIDs and to follow-up with his doctor for his renal insufficiency.  Final Clinical Impressions(s) / ED Diagnoses   Final diagnoses:  None    New Prescriptions New Prescriptions   No medications on file     Lorre Nick, MD 04/14/17 [phone]

## 2017-04-14 NOTE — Discharge Instructions (Signed)
Your creatinine today was 1.5. Please avoid all medications containing NSAIDs such as ibuprofen or Motrin. Call your doctor Monday to schedule a follow-up visit

## 2017-04-14 NOTE — ED Triage Notes (Signed)
Pt c/o clamminess, feeling warm, right side pain radiating to back onset Tuesday. Bilateral burning and weakness to medial legs, dorsal aspect of feet, throughout hands, arms, and painful swallowing onset today. Pt sounds as if nasal congestion present, pt denies nasal congestion but reports new need to breath through mouth instead of nose.

## 2017-04-19 LAB — CULTURE, BLOOD (ROUTINE X 2)
Culture: NO GROWTH
Special Requests: ADEQUATE

## 2017-04-20 LAB — CULTURE, BLOOD (ROUTINE X 2)
Culture: NO GROWTH
Special Requests: ADEQUATE

## 2017-11-29 ENCOUNTER — Encounter (HOSPITAL_BASED_OUTPATIENT_CLINIC_OR_DEPARTMENT_OTHER): Payer: Self-pay | Admitting: *Deleted

## 2017-11-29 ENCOUNTER — Emergency Department (HOSPITAL_BASED_OUTPATIENT_CLINIC_OR_DEPARTMENT_OTHER): Payer: PRIVATE HEALTH INSURANCE

## 2017-11-29 ENCOUNTER — Other Ambulatory Visit: Payer: Self-pay

## 2017-11-29 ENCOUNTER — Emergency Department (HOSPITAL_BASED_OUTPATIENT_CLINIC_OR_DEPARTMENT_OTHER)
Admission: EM | Admit: 2017-11-29 | Discharge: 2017-11-29 | Disposition: A | Discharge status: Home / Self Care | Payer: PRIVATE HEALTH INSURANCE | Attending: Emergency Medicine | Admitting: Emergency Medicine

## 2017-11-29 DIAGNOSIS — R1011 Right upper quadrant pain: Secondary | ICD-10-CM | POA: Diagnosis present

## 2017-11-29 DIAGNOSIS — R072 Precordial pain: Secondary | ICD-10-CM | POA: Insufficient documentation

## 2017-11-29 DIAGNOSIS — N183 Chronic kidney disease, stage 3 unspecified: Secondary | ICD-10-CM

## 2017-11-29 DIAGNOSIS — Z79899 Other long term (current) drug therapy: Secondary | ICD-10-CM | POA: Insufficient documentation

## 2017-11-29 DIAGNOSIS — Z7982 Long term (current) use of aspirin: Secondary | ICD-10-CM | POA: Diagnosis not present

## 2017-11-29 HISTORY — DX: Depression, unspecified: F32.A

## 2017-11-29 HISTORY — DX: Major depressive disorder, single episode, unspecified: F32.9

## 2017-11-29 HISTORY — DX: Hyperlipidemia, unspecified: E78.5

## 2017-11-29 LAB — CBC WITH DIFFERENTIAL/PLATELET
BASOS PCT: 0 %
Basophils Absolute: 0 10*3/uL (ref 0.0–0.1)
EOS ABS: 0.2 10*3/uL (ref 0.0–0.7)
EOS PCT: 3 %
HCT: 43.4 % (ref 39.0–52.0)
HEMOGLOBIN: 15.1 g/dL (ref 13.0–17.0)
LYMPHS ABS: 2 10*3/uL (ref 0.7–4.0)
Lymphocytes Relative: 32 %
MCH: 28.2 pg (ref 26.0–34.0)
MCHC: 34.8 g/dL (ref 30.0–36.0)
MCV: 81.1 fL (ref 78.0–100.0)
MONOS PCT: 10 %
Monocytes Absolute: 0.6 10*3/uL (ref 0.1–1.0)
Neutro Abs: 3.6 10*3/uL (ref 1.7–7.7)
Neutrophils Relative %: 55 %
PLATELETS: 228 10*3/uL (ref 150–400)
RBC: 5.35 MIL/uL (ref 4.22–5.81)
RDW: 14.2 % (ref 11.5–15.5)
WBC: 6.4 10*3/uL (ref 4.0–10.5)

## 2017-11-29 LAB — COMPREHENSIVE METABOLIC PANEL
ALK PHOS: 64 U/L (ref 38–126)
ALT: 23 U/L (ref 17–63)
AST: 20 U/L (ref 15–41)
Albumin: 4.1 g/dL (ref 3.5–5.0)
Anion gap: 8 (ref 5–15)
BILIRUBIN TOTAL: 0.5 mg/dL (ref 0.3–1.2)
BUN: 16 mg/dL (ref 6–20)
CALCIUM: 9.1 mg/dL (ref 8.9–10.3)
CO2: 27 mmol/L (ref 22–32)
CREATININE: 1.59 mg/dL — AB (ref 0.61–1.24)
Chloride: 107 mmol/L (ref 101–111)
GFR calc non Af Amer: 48 mL/min — ABNORMAL LOW (ref >60)
GFR, EST AFRICAN AMERICAN: 56 mL/min — AB (ref >60)
GLUCOSE: 102 mg/dL — AB (ref 65–99)
Potassium: 3.9 mmol/L (ref 3.5–5.1)
Sodium: 142 mmol/L (ref 135–145)
TOTAL PROTEIN: 6.5 g/dL (ref 6.5–8.1)

## 2017-11-29 LAB — D-DIMER, QUANTITATIVE (NOT AT ARMC): D DIMER QUANT: 0.29 ug{FEU}/mL (ref 0.00–0.50)

## 2017-11-29 LAB — LIPASE, BLOOD: Lipase: 32 U/L (ref 11–51)

## 2017-11-29 LAB — TROPONIN I: Troponin I: <0.03 ng/mL (ref <0.03)

## 2017-11-29 MED ORDER — DICYCLOMINE HCL 10 MG PO CAPS: 10.0000 mg | ORAL_CAPSULE | Freq: Three times a day (TID) | ORAL | 0 refills | Status: AC

## 2017-11-29 MED ORDER — KETOROLAC TROMETHAMINE 30 MG/ML IJ SOLN
30.0000 mg | Freq: Once | INTRAMUSCULAR | Status: AC
  Administered 2017-11-29: 30 mg via INTRAVENOUS
  Filled 2017-11-29: qty 1

## 2017-11-29 NOTE — Discharge Instructions (Addendum)
Mr. Patrick Lindsey,  Your chest pain does not appear to be coming from the heart. Your EKG was normal and troponin (measure of heart stress) was negative. A marker for pulmonary embolism (d-dimer) was negative.   Your creatinine was elevated at 1.59. Please see your primary doctor to discuss this and determine further work-up. Take tylenol instead of ibuprofen. You could also try some topical lidocaine or capsaicin for pain. Your blood pressure was also a little elevated during today's visit.   Try bentyl to see if that helps upper abdominal pain.

## 2017-11-29 NOTE — ED Notes (Signed)
ED Provider at bedside. 

## 2017-11-29 NOTE — ED Notes (Signed)
Patient transported to X-ray 

## 2017-11-29 NOTE — ED Triage Notes (Signed)
Pt c/o right upper abd pain x 5 months Recent US which was neg , increased pain x 2 hrs

## 2017-11-29 NOTE — ED Provider Notes (Signed)
[facility] EMERGENCY DEPARTMENT Provider Note   CSN: [id number] Arrival date & time: 11/29/17  1649     History   Chief Complaint Chief Complaint  Patient presents with  . Abdominal Pain    HPI Patrick Lindsey is a 52 y.o. male with history of anxiety/depression and HLD who presents for worsening chest/RUQ pain.   HPI  Patient presents for acutely worse pain of right upper abdomen/lower anterior chest while eating lunch today. He reports being evaluated for this with ultrasound last year without signs of gallstones. He describes pain as mostly sharp, and it has been present every day for the last 5 months but was more severe than it ever has been today. He denies associated nausea or vomiting. No changes in weight. He was told to try omeprazole in case of GERD but noted no improvement so stopped this. Pain today started while eating lunch but typically is not associated with meals. Twisting motions have made it worse before. He reports regular BMs and no blood in stool. He denies SOB or sensation of chest pressure. Denies fever or back pain. No rash prior to onset of symptoms. He takes ibuprofen sometimes for pain.   He says he has a friend who has a liver mass that developed rather suddenly, which makes him concerned.   He denies known family history of heart problems.   Rarely has EtOH. Denies drug use.   Past Medical History:  Diagnosis Date  . Depression   . Hyperlipemia     Patient Active Problem List   Diagnosis Date Noted  . Achilles tendinitis, right leg 01/31/2017  . Achilles tendinitis, left leg 01/31/2017  . Anxiety 01/04/2017  . Depression 01/04/2017  . 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 Mixed hyperlipidemia 09/05/2016    Past Surgical History:  Procedure Laterality Date  . TONSILLECTOMY         Home Medications    Prior to Admission medications   Medication Sig Start Date End Date Taking? Authorizing Provider  aspirin EC  81 MG tablet Take 81 mg by mouth daily.    [provider]  buPROPion (WELLBUTRIN XL) 150 MG 24 hr tablet Take 150 mg by mouth. 03/13/17   [provider]  dicyclomine (BENTYL) 10 MG capsule Take 1 capsule (10 mg total) by mouth 4 (four) times daily -  before meals and at bedtime. 11/29/17   Casey Burkitt, MD  DULoxetine (CYMBALTA) 60 MG capsule TAKE ONE CAPSULE BY MOUTH DAILY 03/13/17   [provider]  loratadine (CLARITIN) 10 MG tablet Take 10 mg by mouth daily.    [provider]  Multiple Vitamins-Minerals (MULTIVITAMIN ADULTS PO) Take 1 tablet by mouth daily.    [provider]  simvastatin (ZOCOR) 40 MG tablet Take 20 mg by mouth daily.     [provider]    Family History History reviewed. No pertinent family history.  Social History Social History   Tobacco Use  . Smoking status: Never Smoker  . Smokeless tobacco: Never Used  Substance Use Topics  . Alcohol use: No    Frequency: Never  . Drug use: No     Allergies   Prednisone   Review of Systems Review of Systems  Constitutional: Negative for activity change, chills and fever.  HENT: Negative for congestion and rhinorrhea.   Eyes: Negative for pain.  Respiratory: Negative for cough and shortness of breath.   Cardiovascular: Positive for chest pain.  Gastrointestinal: Positive for abdominal  pain.  Genitourinary: Negative for dysuria.  Musculoskeletal: Negative for myalgias.  Skin: Negative for rash.  Neurological: Negative for dizziness and weakness.     Physical Exam Updated Vital Signs BP (!) 141/97   Pulse 77   Temp 98.3 F (36.8 C) (Oral)   Resp 13   Ht 5\' 11"  (1.803 m)   Wt 104.8 kg (231 lb)   SpO2 95%   BMI 32.22 kg/m   Physical Exam  Constitutional: He appears well-developed and well-nourished.  Anxious, pleasant patient. Occasionally grabbing right lower chest wall for brief moments.   HENT:  Head: Normocephalic and atraumatic.   Mouth/Throat: Oropharynx is clear and moist.  Eyes: EOM are normal. Pupils are equal, round, and reactive to light. No scleral icterus.  Cardiovascular: Normal rate and regular rhythm.  No murmur heard. Pulmonary/Chest: Effort normal and breath sounds normal. He has no wheezes. Tenderness: occasionally could bring about mild TTP over lower anterior chest wall   Abdominal: Soft. Normal appearance and bowel sounds are normal. He exhibits no ascites. There is no hepatosplenomegaly. There is no tenderness. There is no CVA tenderness. No hernia.  Nursing note and vitals reviewed.    ED Treatments / Results  Labs (all labs ordered are listed, but only abnormal results are displayed) Labs Reviewed  COMPREHENSIVE METABOLIC PANEL - Abnormal; Notable for the following components:      Result Value   Glucose, Bld 102 (*)    Creatinine, Ser 1.59 (*)    GFR calc non Af Amer 48 (*)    GFR calc Af Amer 56 (*)    All other components within normal limits  LIPASE, BLOOD  CBC WITH DIFFERENTIAL/PLATELET  TROPONIN I  D-DIMER, QUANTITATIVE (NOT AT [facility])    EKG  EKG Interpretation  Date/Time:  Wednesday November 29 2017 19:03:44 EST Ventricular Rate:  80 PR Interval:    QRS Duration: 85 QT Interval:  399 QTC Calculation: 461 R Axis:   57 Text Interpretation:  Sinus rhythm No STEMI.  Confirmed by Alona Bene [phone]) on 11/29/2017 7:06:22 PM       Radiology Dg Chest 2 View  Result Date: 11/29/2017 CLINICAL DATA:  Shortness of Breath EXAM: CHEST  2 VIEW COMPARISON:  None. FINDINGS: Lungs are clear. Heart size and pulmonary vascularity are normal. No adenopathy. No pneumothorax. No bone lesions. IMPRESSION: No edema or consolidation. Electronically Signed   By: Bretta Bang III M.D.   On: 11/29/2017 19:03    Procedures Procedures (including critical care time)  Medications Ordered in ED Medications  ketorolac (TORADOL) 30 MG/ML injection 30 mg (30 mg Intravenous Given 11/29/17 1849)       Initial Impression / Assessment and Plan / ED Course  I have reviewed the triage vital signs and the nursing notes.  Pertinent labs & imaging results that were available during my care of the patient were reviewed by me and considered in my medical decision making (see chart for details).  CXR obtained to rule-out small pleural effusion. This was negative. D-dimer negative, making PE highly unlikely.   EKG with NSR and troponin negative x 1. Description of chest pain is atypical, so repeat troponin not performed.   Patient with normal lipase, LFTs, and no leukocytosis.   Kidney function remains elevated. Abdominal US performed last year showed normal-sized kidneys. BP only mildly elevated. Patient does report NSAID use.   Recommended follow-up with his PCP for continued monitoring and workup of decreased kidney function. Could consider renal consult if GFR  continues to drop (down by 3 mL/min compared to last measurement). Recommended using tylenol for discomfort for time being and trying bentyl, as pain involves upper abdomen. VSS at time of discharge.   Final Clinical Impressions(s) / ED Diagnoses   Final diagnoses:  Precordial chest pain  Stage 3 chronic kidney disease [facility](HCC)    ED Discharge Orders        Ordered    dicyclomine (BENTYL) 10 MG capsule  3 times daily before meals & bedtime     11/29/17 2033       Casey BurkittFitzgerald, Houa Nie Moen, MD 11/30/17 0230    Maia PlanLong, Joshua G, MD 11/30/17 1501

## 2018-04-05 IMAGING — CR DG FOOT COMPLETE 3+V*R*
3 series · 3 of 3 positions shown · non-contrast
Comparison: None.

CLINICAL DATA: Was standing on [employer] pallet; it broke and his
right foot went through it. Occurred 2 days ago. Right foot pain,
altered gait.

EXAM:
RIGHT FOOT COMPLETE - 3+ VIEW

[t foot ap right]
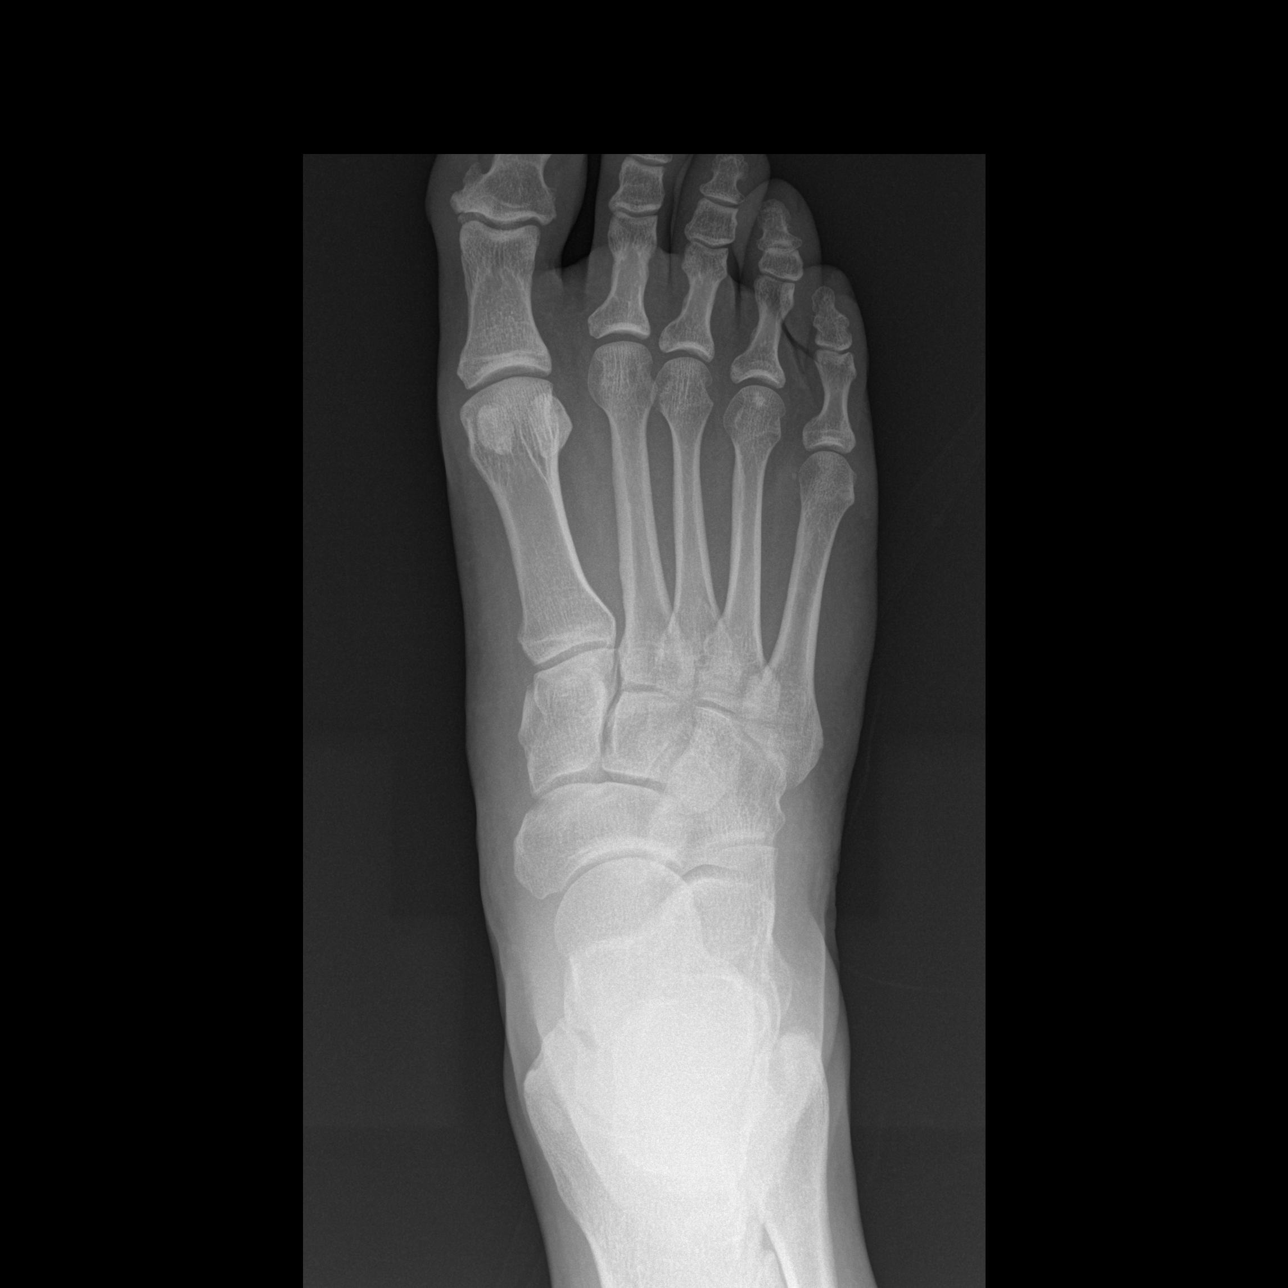

[t foot oblique right]
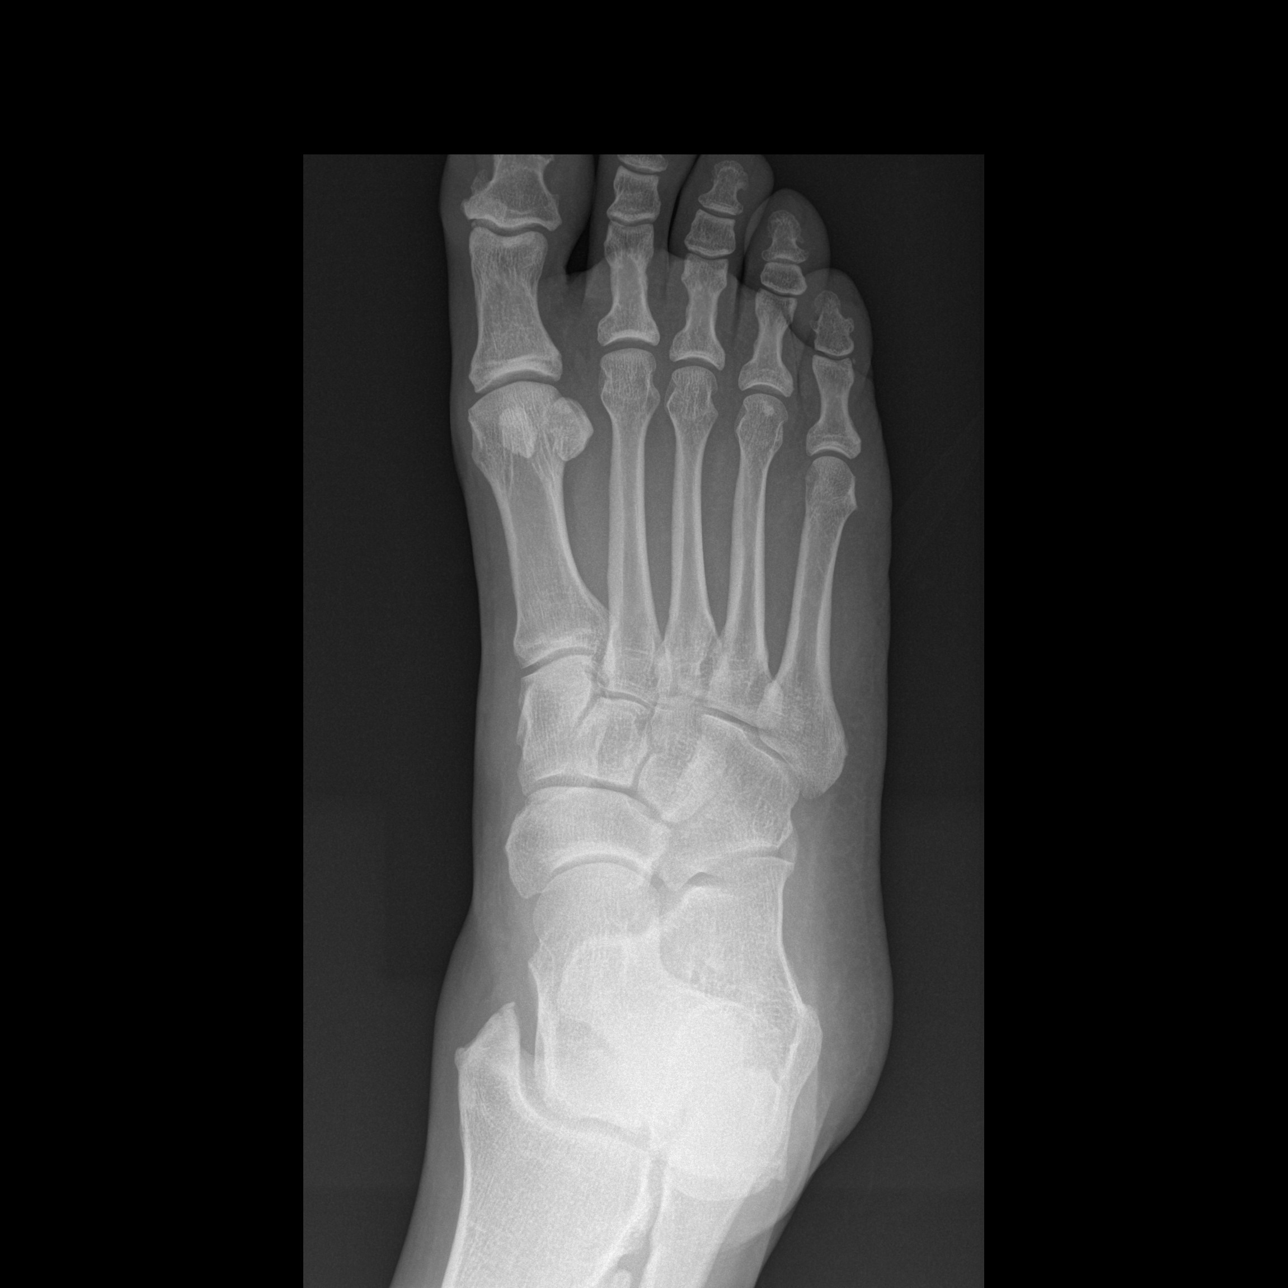

[t foot lat right]
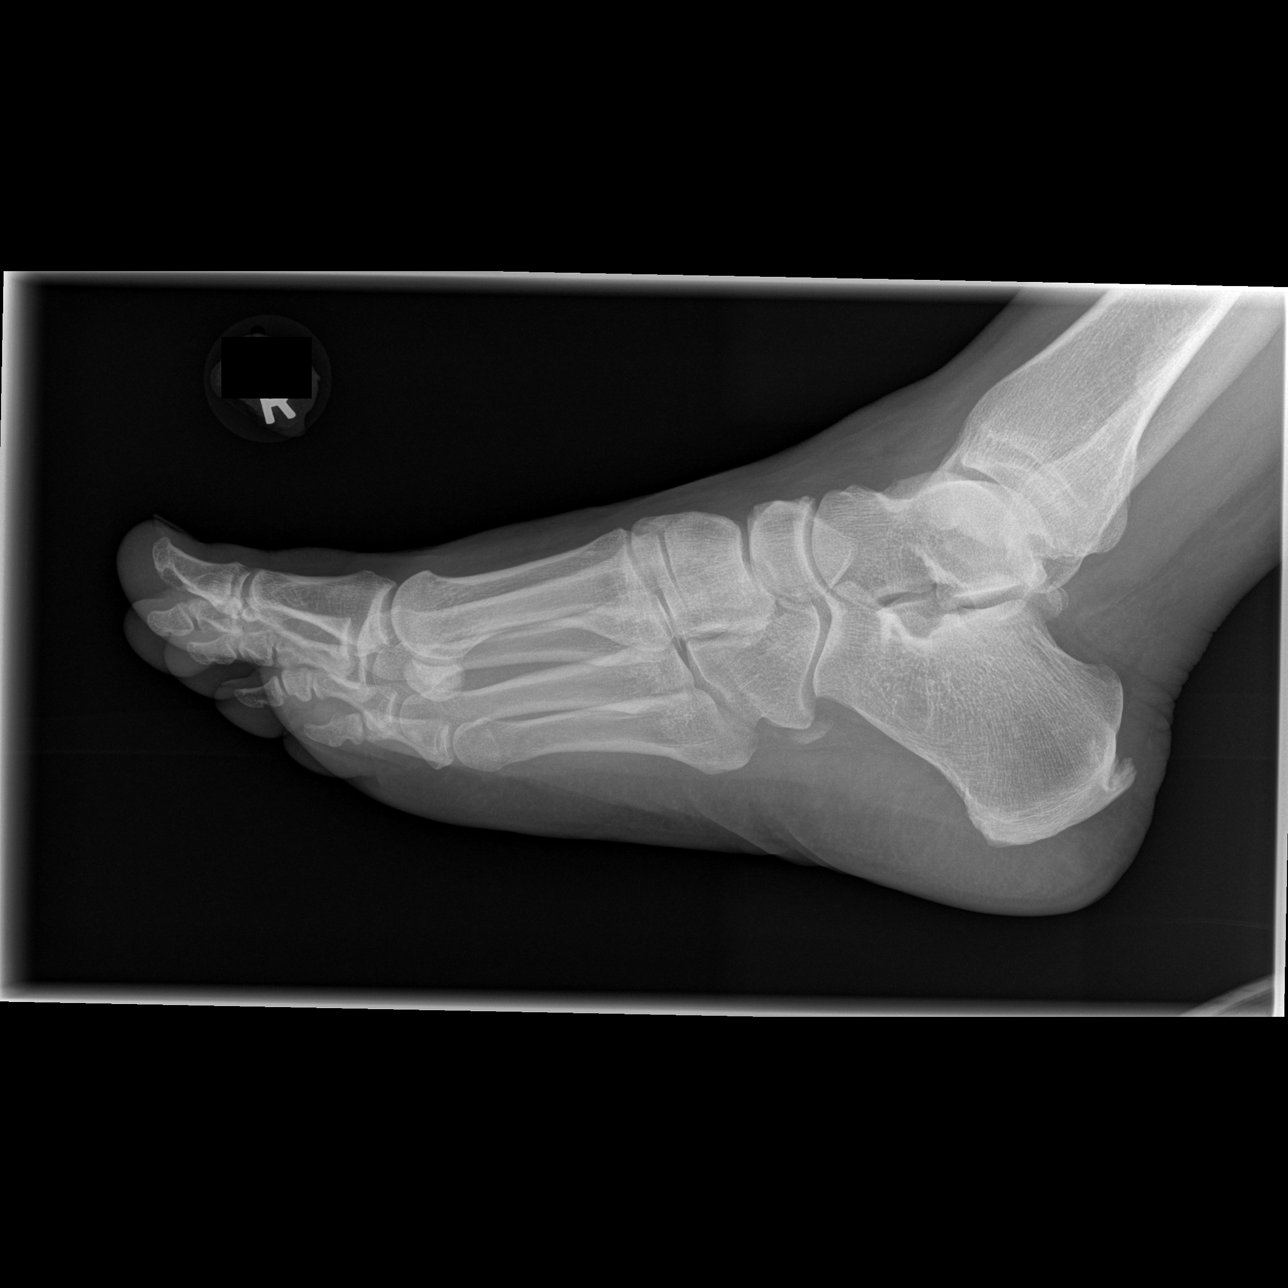

[3 of 3 positions shown; findings below may reference images not displayed]

FINDINGS: A partially circumscribed bone density is seen adjacent to the
cuboid bone, favored to represent an os peroneum. However,
correlation is recommended with point tenderness in this location to
exclude fracture. Otherwise, no acute abnormalities are identified.
An Achilles spur is present.
IMPRESSION: 1. Favor os perineum over fracture adjacent to the cuboid bone.
Correlation with physical exam is recommended.
2. Otherwise, no acute findings.

## 2019-03-11 IMAGING — CR DG CHEST 2V
2 series · 2 of 2 positions shown · non-contrast
Comparison: None.

CLINICAL DATA: Shortness of Breath

EXAM:
CHEST  2 VIEW

[w chest pa]
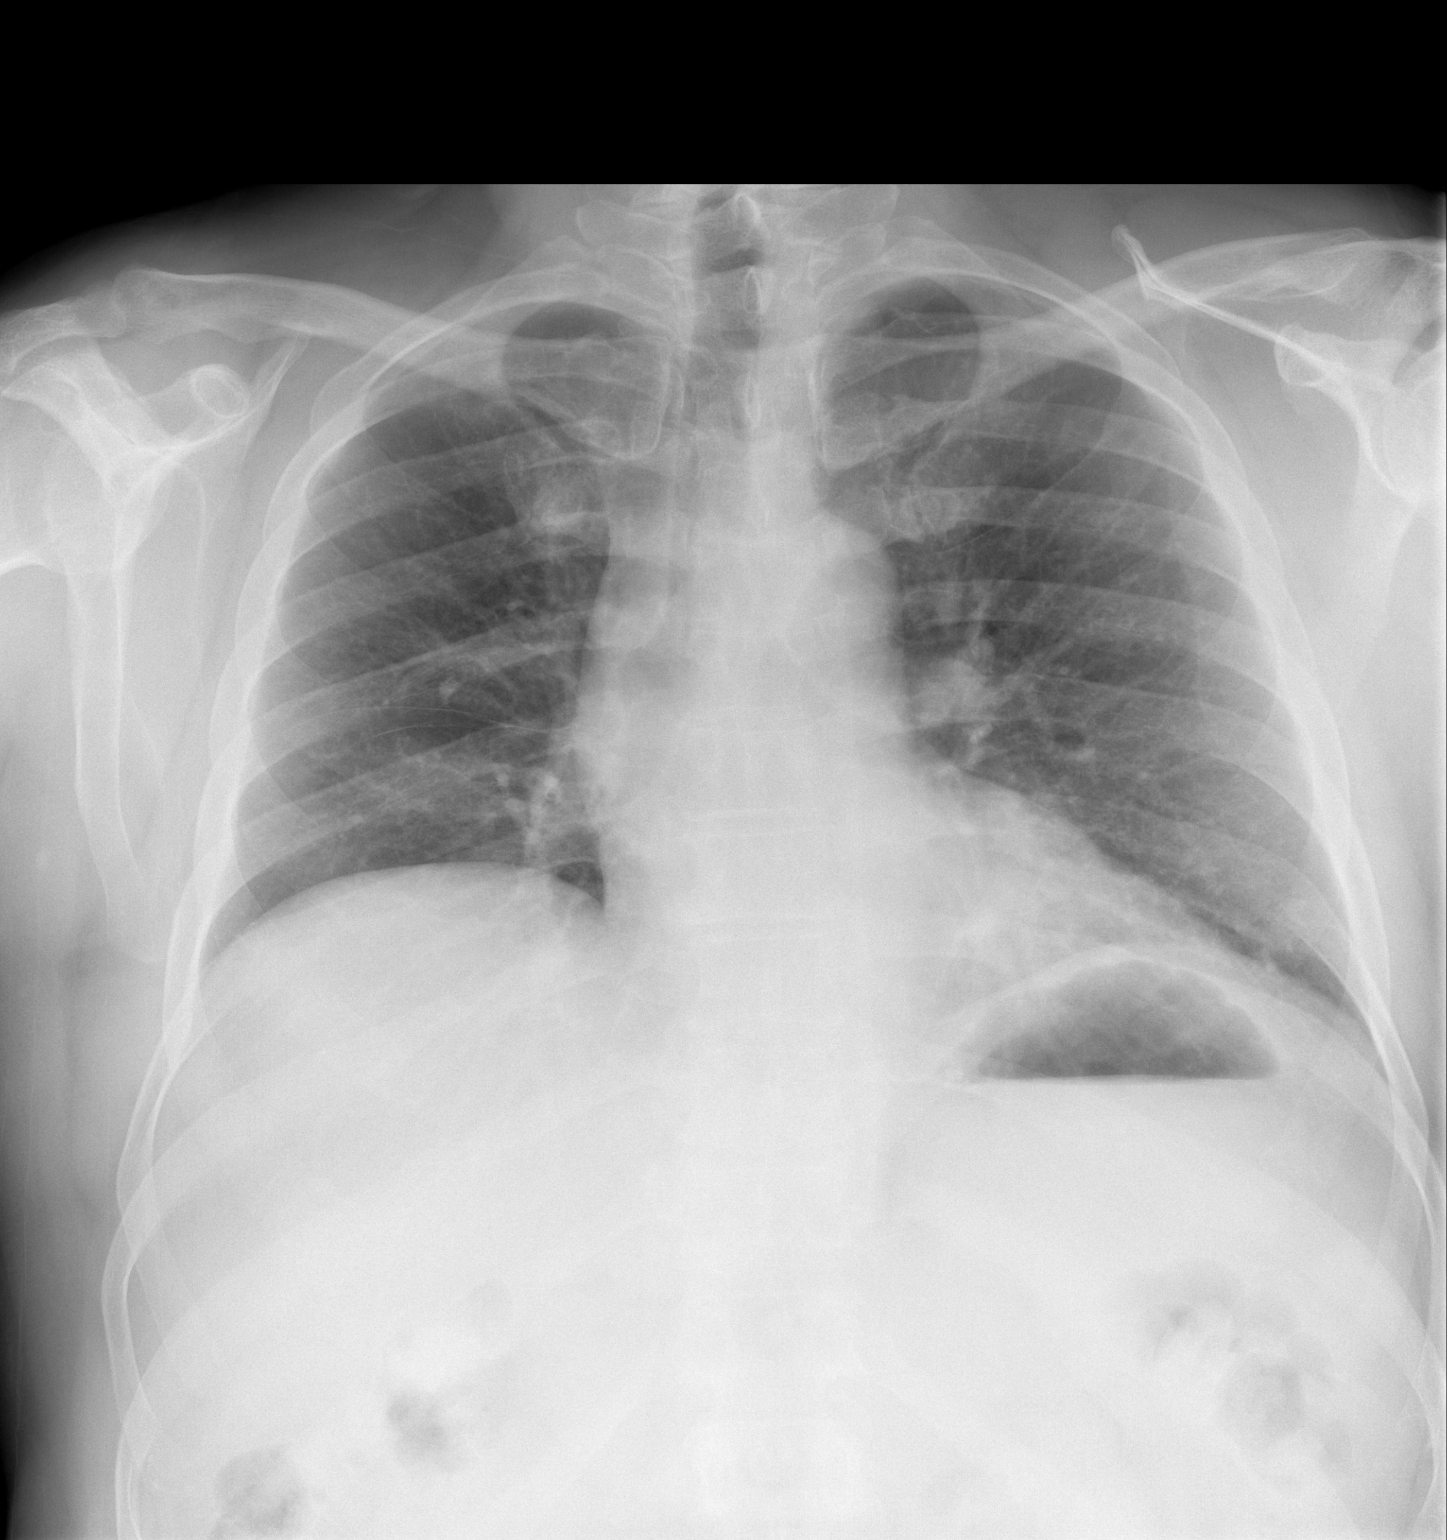

[w chest lat]
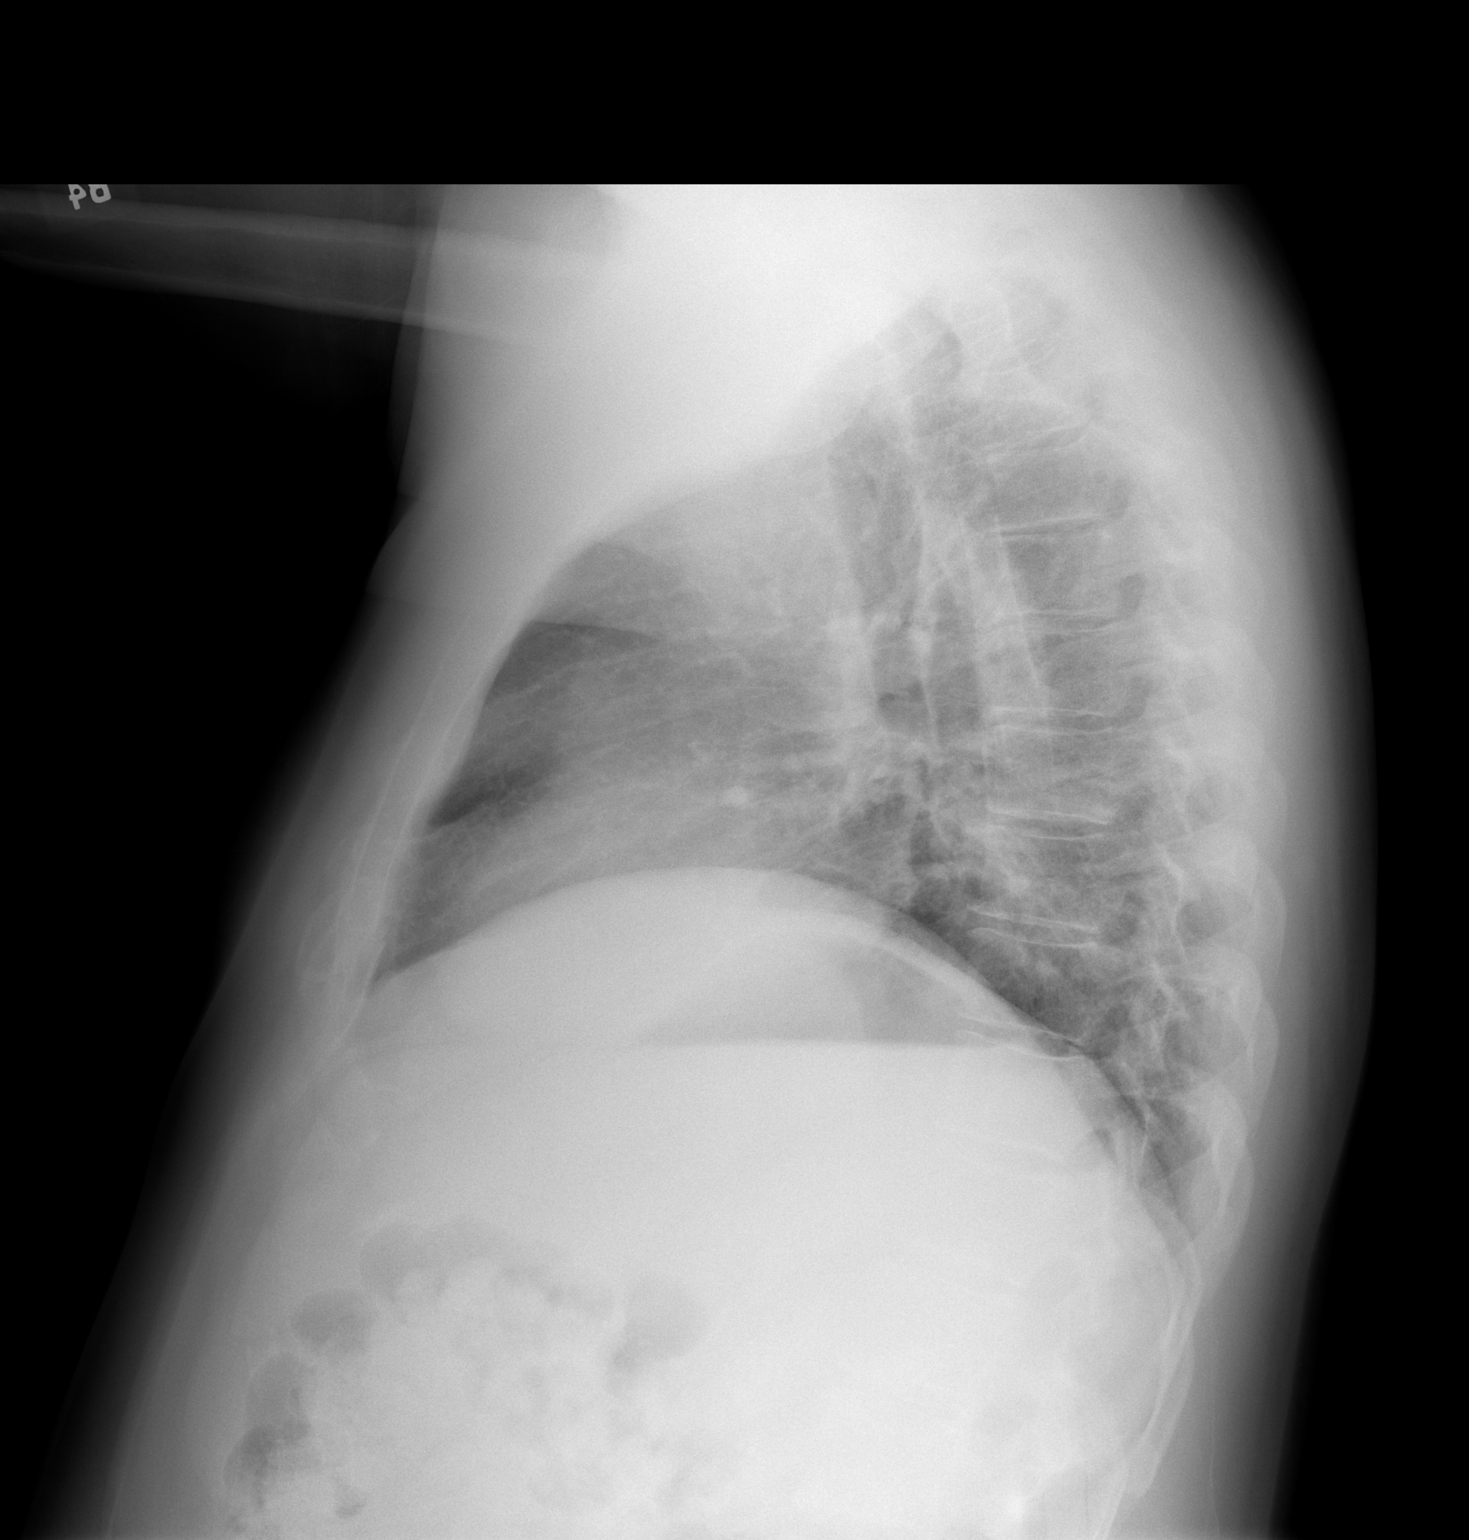

[2 of 2 positions shown; findings below may reference images not displayed]

FINDINGS: Lungs are clear. Heart size and pulmonary vascularity are normal. No
adenopathy. No pneumothorax. No bone lesions.
IMPRESSION: No edema or consolidation.
